# Patient Record
Sex: Male | Born: 1985 | Race: White | Hispanic: Yes | Marital: Single | Smoking: Never smoker
Health system: Southern US, Community
[De-identification: ages and names within clinical notes are randomized; demographics above are authoritative.]

---

## 2011-10-03 ENCOUNTER — Emergency Department (HOSPITAL_COMMUNITY)
Admission: EM | Admit: 2011-10-03 | Discharge: 2011-10-03 | Disposition: A | Discharge status: Home / Self Care | Payer: Worker's Compensation | Attending: Emergency Medicine | Admitting: Emergency Medicine

## 2011-10-03 ENCOUNTER — Encounter: Payer: Self-pay | Admitting: Nurse Practitioner

## 2011-10-03 DIAGNOSIS — W260XXA Contact with knife, initial encounter: Secondary | ICD-10-CM | POA: Insufficient documentation

## 2011-10-03 DIAGNOSIS — S61209A Unspecified open wound of unspecified finger without damage to nail, initial encounter: Secondary | ICD-10-CM | POA: Insufficient documentation

## 2011-10-03 DIAGNOSIS — S6990XA Unspecified injury of unspecified wrist, hand and finger(s), initial encounter: Secondary | ICD-10-CM | POA: Insufficient documentation

## 2011-10-03 DIAGNOSIS — S6980XA Other specified injuries of unspecified wrist, hand and finger(s), initial encounter: Secondary | ICD-10-CM | POA: Insufficient documentation

## 2011-10-03 MED ORDER — TETANUS-DIPHTH-ACELL PERTUSSIS 5-2.5-18.5 LF-MCG/0.5 IM SUSP
0.5000 mL | Freq: Once | INTRAMUSCULAR | Status: AC
  Administered 2011-10-03: 0.5 mL via INTRAMUSCULAR
  Filled 2011-10-03: qty 0.5

## 2011-10-03 NOTE — ED Notes (Signed)
NP at bedside to suture pt.
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
## 2011-10-05 ENCOUNTER — Emergency Department (INDEPENDENT_AMBULATORY_CARE_PROVIDER_SITE_OTHER)
Admission: EM | Admit: 2011-10-05 | Discharge: 2011-10-05 | Disposition: A | Discharge status: Home / Self Care | Payer: Self-pay | Source: Home / Self Care

## 2011-10-05 DIAGNOSIS — Z4800 Encounter for change or removal of nonsurgical wound dressing: Secondary | ICD-10-CM

## 2011-10-05 DIAGNOSIS — IMO0001 Reserved for inherently not codable concepts without codable children: Secondary | ICD-10-CM

## 2011-10-05 NOTE — ED Provider Notes (Signed)
History     CSN: [id number] Arrival date & time: 10/03/2011  6:57 PM   First MD Initiated Contact with Patient 10/03/11 1935      Chief Complaint  Patient presents with  . Finger Injury     HPI Pt reports through his interpreter that he was using a box cutter and cut to tip of his (L) 3rd finger. States he has been unable to get the bleeding to stop. Unknown tetanus status.  History reviewed. No pertinent past medical history.  History reviewed. No pertinent past surgical history.  History reviewed. No pertinent family history.  History  Substance Use Topics  . Smoking status: Never Smoker   . Smokeless tobacco: Not on file  . Alcohol Use: 6.0 oz/week    10 Shots of liquor per week      Review of Systems  Constitutional: Negative.   HENT: Negative.   Eyes: Negative.   Respiratory: Negative.   Cardiovascular: Negative.   Gastrointestinal: Negative.   Genitourinary: Negative.   Musculoskeletal: Negative.   Skin: Negative.   Neurological: Negative.   Hematological: Negative.   Psychiatric/Behavioral: Negative.     Allergies  Review of patient's allergies indicates no known allergies.  Home Medications  No current outpatient prescriptions on file.  BP 143/100  Pulse 66  Temp(Src) 99.4 F (37.4 C) (Oral)  Resp 16  SpO2 98%  Physical Exam  Constitutional: He is oriented to person, place, and time. He appears well-developed and well-nourished.  HENT:  Head: Normocephalic and atraumatic.  Eyes: Pupils are equal, round, and reactive to light.  Neck: Neck supple.  Cardiovascular: Normal rate and regular rhythm.   Pulmonary/Chest: Effort normal and breath sounds normal.  Abdominal: Soft. Bowel sounds are normal.  Musculoskeletal:       Hands:      Complete avulsion laceration to level of sub-q tissue,  to palmer aspect of DIP w/ active bleeding noted.  Neurological: He is alert and oriented to person, place, and time.  Skin: Skin is warm and dry.    Psychiatric: He has a normal mood and affect.    ED Course  Procedures Attempted to seal wound w/ Wound Seal Granules w/o success.  Attempt to apply gelfoam but bleeding, though reduced,  persist.   Quick clot compression dressing x 30 min has slow bleeding. New gelfoam reapplied w/ compression dressing. T-Dap updated. Instructed pt to keep dressing in place for 48 hours and then to return to [facility] to have dressing  Removed and wound rechecked. Pt verbalizes understanding and is agreeable w/ plan.  Labs Reviewed - No data to display No results found.   1. Avulsion of finger       MDM  Avulsion lac to palmer aspect of  (L) 3rd finger w/ loss of avulsed flap.        Leanne Chang, NP 10/05/11 0222

## 2011-10-05 NOTE — ED Provider Notes (Signed)
History     CSN: [id number] Arrival date & time: 10/05/2011  5:00 PM   First MD Initiated Contact with Patient 10/05/11 1615      Chief Complaint  Patient presents with  . Wound Check    saturday was seen at [facility] Emergency Department for left middle finger laceration. the ED cleaned wound, and dressed.  no stitches were required. he is here for wound check.     (Consider location/radiation/quality/duration/timing/severity/associated sxs/prior treatment) HPI Comments: Left handed works in a [employer]. Cut the tip of his left middle finger with a work knife while at work 2 days ago. Had hemostatic dressing put on and asked to come here for follow up. No bleeding but dressing is attached to the wound and has not been removed since last ED visit.  Denies redness, swelling or pain.   Patient is a 25 y.o. male presenting with wound check. The history is provided by the patient.  Wound Check     History reviewed. No pertinent past medical history.  History reviewed. No pertinent past surgical history.  History reviewed. No pertinent family history.  History  Substance Use Topics  . Smoking status: Never Smoker   . Smokeless tobacco: Never Used  . Alcohol Use: Yes     occational      Review of Systems  Constitutional: Negative.   Musculoskeletal:       Can move his left hand digits with no difficutlty    Allergies  Review of patient's allergies indicates no known allergies.  Home Medications  No current outpatient prescriptions on file.  BP 147/89  Pulse 76  Temp(Src) 97.8 F (36.6 C) (Oral)  Resp 20  SpO2 100%  Physical Exam  Nursing note and vitals reviewed. Constitutional: He is oriented to person, place, and time. He appears well-developed and well-nourished. No distress.  Musculoskeletal:       Left hand: middle finger. Wound and banage were soaked in saline solution and dressing was able to be removed Missing skin in half of the tip of the  distal phalange. Good granulation tissue no active bleeding no signs of infetion. Normal flexion and extension of distal IPJ. Wound was cleaned and sterile non adherent dressing was placed.  Neurological: He is alert and oriented to person, place, and time.    ED Course  Procedures (including critical care time)  Labs Reviewed - No data to display No results found.   1. Wound check, dressing change       MDM          Sharin Grave, MD 10/08/11 1139

## 2011-10-05 NOTE — ED Notes (Signed)
saturday was seen at [facility] Emergency Department for left middle finger laceration. the ED cleaned wound, and dressed.  no stitches were required. he is here for wound check.

## 2011-10-05 NOTE — ED Provider Notes (Signed)
Medical screening examination/treatment/procedure(s) were performed by non-physician practitioner and as supervising physician I was immediately available for consultation/collaboration.   Laray Anger, DO 10/05/11 1324

## 2011-11-20 ENCOUNTER — Encounter: Payer: Self-pay | Admitting: Nurse Practitioner

## 2013-11-09 ENCOUNTER — Encounter (HOSPITAL_COMMUNITY): Payer: Self-pay | Admitting: Emergency Medicine

## 2013-11-09 ENCOUNTER — Emergency Department (HOSPITAL_COMMUNITY): Payer: Worker's Compensation

## 2013-11-09 ENCOUNTER — Emergency Department (HOSPITAL_COMMUNITY)
Admission: EM | Admit: 2013-11-09 | Discharge: 2013-11-09 | Payer: Worker's Compensation | Attending: Emergency Medicine | Admitting: Emergency Medicine

## 2013-11-09 DIAGNOSIS — F1010 Alcohol abuse, uncomplicated: Secondary | ICD-10-CM | POA: Insufficient documentation

## 2013-11-09 DIAGNOSIS — F10929 Alcohol use, unspecified with intoxication, unspecified: Secondary | ICD-10-CM

## 2013-11-09 DIAGNOSIS — S0003XA Contusion of scalp, initial encounter: Secondary | ICD-10-CM | POA: Insufficient documentation

## 2013-11-09 DIAGNOSIS — IMO0002 Reserved for concepts with insufficient information to code with codable children: Secondary | ICD-10-CM | POA: Insufficient documentation

## 2013-11-09 DIAGNOSIS — S1093XA Contusion of unspecified part of neck, initial encounter: Principal | ICD-10-CM

## 2013-11-09 DIAGNOSIS — S40029A Contusion of unspecified upper arm, initial encounter: Secondary | ICD-10-CM | POA: Insufficient documentation

## 2013-11-09 DIAGNOSIS — S0083XA Contusion of other part of head, initial encounter: Principal | ICD-10-CM

## 2013-11-09 DIAGNOSIS — Y92009 Unspecified place in unspecified non-institutional (private) residence as the place of occurrence of the external cause: Secondary | ICD-10-CM | POA: Insufficient documentation

## 2013-11-09 DIAGNOSIS — S0990XA Unspecified injury of head, initial encounter: Secondary | ICD-10-CM

## 2013-11-09 DIAGNOSIS — Y9389 Activity, other specified: Secondary | ICD-10-CM | POA: Insufficient documentation

## 2013-11-09 LAB — POCT I-STAT, CHEM 8
BUN: 6 mg/dL (ref 6–23)
CALCIUM ION: 1.17 mmol/L (ref 1.12–1.23)
CHLORIDE: 106 meq/L (ref 96–112)
CREATININE: 1.1 mg/dL (ref 0.50–1.35)
GLUCOSE: 127 mg/dL — AB (ref 70–99)
HCT: 48 % (ref 39.0–52.0)
Hemoglobin: 16.3 g/dL (ref 13.0–17.0)
Potassium: 3.7 mEq/L (ref 3.7–5.3)
Sodium: 147 mEq/L (ref 137–147)
TCO2: 25 mmol/L (ref 0–100)

## 2013-11-09 LAB — ETHANOL: ALCOHOL ETHYL (B): 227 mg/dL — AB (ref 0–11)
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
## 2013-11-09 NOTE — Discharge Instructions (Signed)
Intoxicacin alcohlica (Alcohol Intoxication) La intoxicacin alcohlica se produce cuando la cantidad de alcohol que se ha consumido daa la capacidad de funcionamiento mental y fsico. El alcohol deteriora directamente la actividad qumica normal del cerebro. Beber grandes cantidades de alcohol puede conducir a Insurance underwriter funcionamiento mental y en el comportamiento, y puede causar muchos efectos fsicos que pueden ser perjudiciales.  La intoxicacin alcohlica puede variar en gravedad desde leve hasta muy grave. Hay varios factores que pueden afectar el nivel de intoxicacin que se produce, como la edad de la persona, el sexo, el peso, la frecuencia de consumo de alcohol, y la presencia de otras enfermedades mdicas (como diabetes, convulsiones o enfermedades del corazn). Los niveles peligrosos de intoxicacin por alcohol pueden ocurrir [employer] personas beben grandes cantidades de alcohol en un corto periodo de tiempo ([place]). El alcohol tambin puede ser especialmente peligroso cuando se combina con ciertos medicamentos recetados o drogas "recreativas". SIGNOS Y SNTOMAS Algunos de los signos y sntomas comunes de intoxicacin leve por alcohol incluyen:  Prdida de la coordinacin.  Cambios en el estado de nimo y la conducta.  Incapacidad para razonar.  Hablar arrastrando las palabras. A medida que la intoxicacin por alcohol avanza a niveles ms graves, Lucianne Lei a Arts administrator otros signos y sntomas. Estos pueden ser:  Vmitos.  Confusin y alteracin de Sales promotion account executive.  Disminucin de Secretary/administrator.  Convulsiones.  Prdida de la conciencia. DIAGNSTICO  El mdico le har una historia clnica y un examen fsico. Se le preguntar acerca de la cantidad y el tipo de alcohol que ha consumido. Se le realizarn anlisis de sangre para medir la concentracin de alcohol en sangre. En muchos lugares, el nivel de alcohol en la sangre debe ser inferior a 80 mg / dL (0,08%) para  poder conducir legalmente. Sin embargo, hay muchos efectos peligrosos del alcohol que pueden ocurrir con niveles mucho ms bajos.  [place] con intoxicacin por alcohol a menudo no requieren Clinical research associate. La mayor parte de los efectos de la intoxicacin por alcohol son temporales, y desaparecen a medida que el alcohol abandona el cuerpo de forma natural. El profesional controlar su estado hasta que est lo suficientemente estable como para volver a casa. A veces se administran lquidos por va intravenosa para ayudar a evitar la deshidratacin.  INSTRUCCIONES PARA EL CUIDADO EN EL HOGAR  No conduzca vehculos despus de beber alcohol.  Mantngase hidratado. Beba gran cantidad de lquido para mantener la orina de tono claro o color amarillo plido. Evite la cafena.   Tome slo medicamentos de venta libre o recetados, segn las indicaciones del mdico.  SOLICITE ATENCIN MDICA SI:   Tiene vmitos persistentes.   No mejora luego de [employer].  Se intoxica con alcohol con frecuencia. El mdico podr ayudarlo a decidir si debe consultar a un terapeuta especializado en el abuso de sustancias. SOLICITE ATENCIN MDICA DE INMEDIATO SI:   Se siente vacilante o tembloroso cuando trata de abandonar el hbito.   Comienza a temblar de manera incontrolable (convulsiones).   Vomita sangre. Puede ser sangre de color rojo brillante o similar al sedimento del caf negro.   Lollie Marrow en la materia fecal. Puede ser de color rojo brillante o de aspecto alquitranado, con olor ftido.   Se siente mareado o se desmaya.  ASEGRESE DE QUE:   Comprende estas instrucciones.  Controlar su afeccin.  Recibir ayuda de inmediato si no mejora o si empeora. Document Released: 10/26/2005 Document Revised: 06/28/2013 ExitCare Patient Information  2014 [place], [place].  Traumatismo de Psychiatric nursecrneo en el adulto (Head Injury, Adult) Usted ha sufrido un traumatismo de crneo (en la  cabeza) que, en este momento, no reviste gravedad. Una concusin es un estado en el que se modifica la capacidad mental, generalmente como consecuencia de un golpe en la cabeza. Deber ingerir lquidos claros por el resto del da y Omnicomluego volver a su dieta normal. No debe tomar sedantes o bebidas alcohlicas durante el tiempo que se lo indique su mdico posteriores al alta mdica. Luego de lesiones como la suya, pueden ocurrir algunos problemas durante las primeras 24 horas. SNTOMAS Luego del alta puede experimentar estos sntomas menores:  Dificultades con la memoria  Mareos  Dolor de cabeza  Visin doble  Dificultades [phone]  Depresin  Cansancio  Debilidad  Dificultad para concentrarse Si experimenta alguno de estos sntomas (problemas) no debe alarmarse. Un hematoma en el encfalo (concusin) requiere de Time Warneralgunos das para su recuperacin. Muchos pacientes con lesiones en la cabeza experimentan con frecuencia estos sntomas. Generalmente, estos problemas desaparecen sin atencin mdica. Sin embargo, si los sntomas persisten durante ms de Civil engineer, contractingun da, pngase en contacto con el profesional que lo asiste. Concurra rpidamente a Optician, dispensinguna consulta con el profesional que lo asiste si los sntomas empeoran. INSTRUCCIONES PARA EL CUIDADO DOMICILIARIO  Durante las prximas 24 horas, usted debe permanecer con alguna persona que lo cuide y observar si aparece alguno de los signos de alarma mencionados antes. Aunque es improbable que Education officer, environmentalocurra algn efecto indeseado, debe estar alerta con respecto a los signos y sntomas que puede requerir que concurra nuevamente a Educational psychologisteste centro. Los efectos secundarios pueden ocurrir The Krogerentre los 7 y [address] das posteriores a la lesin. Es importante que controle cuidadosamente su problema y que se comunique con el profesional que lo asiste o busque atencin mdica de inmediato si observa algn cambio. SOLICITE ATENCIN MDICA DE INMEDIATO SI:  Presenta confusin o  somnolencia.  No puede despertar a la persona que sufri la lesin.  Siente nuseas (ganas de vomitar), o vmitos continuos y compulsivos.  Advierte que se marea o presenta una falta de estabilidad cada vez ms marcada o tiene dificultades para caminar.  Presenta convulsiones o est inconsciente.  Experimenta cefalea intensa y persistente que no se alivia con medicamentos. Utilice los medicamentos de venta libre o de prescripcin para Chief Technology Officerel dolor, Environmental health practitionerel malestar o la Santeefiebre, segn se lo indique el profesional que lo asiste.  No puede mover los brazos o las piernas normalmente.  Presenta una secrecin clara o con sangre que proviene de la nariz o de los odos. EST SEGURO QUE:   Comprende las instrucciones para el alta mdica.  Controlar su enfermedad.  Solicitar atencin mdica de inmediato segn las indicaciones. Document Released: 10/26/2005 Document Revised: 01/18/2012 [facility]Exit[facility] Patient Information 2014 [place]ExitCare, [place]LL[place].
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
